# Patient Record
Sex: Male | Born: 1980 | Hispanic: No | Marital: Married | Smoking: Never smoker
Health system: Southern US, Community
[De-identification: ages and names within clinical notes are randomized; demographics above are authoritative.]

---

## 2021-06-01 ENCOUNTER — Other Ambulatory Visit: Payer: Self-pay

## 2021-06-01 ENCOUNTER — Encounter (HOSPITAL_COMMUNITY): Payer: Self-pay | Admitting: Emergency Medicine

## 2021-06-01 ENCOUNTER — Emergency Department (HOSPITAL_COMMUNITY)
Admission: EM | Admit: 2021-06-01 | Discharge: 2021-06-01 | Disposition: A | Discharge status: Home / Self Care | Payer: BC Managed Care – PPO | Attending: Emergency Medicine | Admitting: Emergency Medicine

## 2021-06-01 ENCOUNTER — Emergency Department (HOSPITAL_COMMUNITY): Payer: BC Managed Care – PPO

## 2021-06-01 DIAGNOSIS — R0789 Other chest pain: Secondary | ICD-10-CM | POA: Diagnosis present

## 2021-06-01 DIAGNOSIS — I493 Ventricular premature depolarization: Secondary | ICD-10-CM

## 2021-06-01 DIAGNOSIS — R079 Chest pain, unspecified: Secondary | ICD-10-CM

## 2021-06-01 LAB — BASIC METABOLIC PANEL
Anion gap: 8 (ref 5–15)
BUN: 10 mg/dL (ref 6–20)
CO2: 24 mmol/L (ref 22–32)
Calcium: 9 mg/dL (ref 8.9–10.3)
Chloride: 103 mmol/L (ref 98–111)
Creatinine, Ser: 0.94 mg/dL (ref 0.61–1.24)
GFR, Estimated: >60 mL/min (ref >60)
Glucose, Bld: 138 mg/dL — ABNORMAL HIGH (ref 70–99)
Potassium: 3.6 mmol/L (ref 3.5–5.1)
Sodium: 135 mmol/L (ref 135–145)

## 2021-06-01 LAB — CBC
HCT: 45.4 % (ref 39.0–52.0)
Hemoglobin: 15.6 g/dL (ref 13.0–17.0)
MCH: 30.3 pg (ref 26.0–34.0)
MCHC: 34.4 g/dL (ref 30.0–36.0)
MCV: 88.2 fL (ref 80.0–100.0)
Platelets: 205 10*3/uL (ref 150–400)
RBC: 5.15 MIL/uL (ref 4.22–5.81)
RDW: 11.6 % (ref 11.5–15.5)
WBC: 6 10*3/uL (ref 4.0–10.5)
nRBC: 0 % (ref 0.0–0.2)

## 2021-06-01 LAB — TROPONIN I (HIGH SENSITIVITY)
Troponin I (High Sensitivity): 3 ng/L (ref <18)
Troponin I (High Sensitivity): 3 ng/L (ref <18)

## 2021-06-01 LAB — D-DIMER, QUANTITATIVE: D-Dimer, Quant: <0.27 ug/mL-FEU (ref 0.00–0.50)

## 2021-06-01 MED ORDER — ALUM & MAG HYDROXIDE-SIMETH 200-200-20 MG/5ML PO SUSP
30.0000 mL | Freq: Once | ORAL | Status: AC
  Administered 2021-06-01: 30 mL via ORAL
  Filled 2021-06-01: qty 30

## 2021-06-01 NOTE — ED Notes (Signed)
Patient verbalizes understanding of discharge instructions. Opportunity for questioning and answers were provided. Armband removed by staff, pt discharged from ED ambulatory.   

## 2021-06-01 NOTE — ED Provider Notes (Signed)
Emergency Medicine Provider Triage Evaluation Note  Brion Sossamon , a 40 y.o. male  was evaluated in triage.  Pt complains of an episode of chest pain, sob and palpitations.  Review of Systems  Positive: Cp, sob, palpitations Negative: fever  Physical Exam  BP (!) 152/109 (BP Location: Left Arm)   Pulse (!) 102   Temp 98.8 F (37.1 C) (Oral)   Resp 12   SpO2 100%  Gen:   Awake, no distress   Resp:  Normal effort  MSK:   Moves extremities without difficulty  Other:  Tachycardic, regular rhythm  Medical Decision Making  Medically screening exam initiated at 2:10 PM.  Appropriate orders placed.  Oneil Hodes was informed that the remainder of the evaluation will be completed by another provider, this initial triage assessment does not replace that evaluation, and the importance of remaining in the ED until their evaluation is complete.     Karrie Meres, PA-C 06/01/21 1411    Rozelle Logan, DO 06/03/21 2055

## 2021-06-01 NOTE — Discharge Instructions (Addendum)
Try magnesium supplementation.  Follow-up with cardiology and family doctor in the office.  Please return for worsening or persistent symptoms or symptoms that occur upon exercise.  Your D-dimer test was negative which means this is very unlikely to be a blood clot in the lung your troponins were negative which makes it very unlikely to be a heart attack.

## 2021-06-01 NOTE — ED Provider Notes (Signed)
[facility] EMERGENCY DEPARTMENT Provider Note   CSN: [id number] Arrival date & time: 06/01/21  1359     History Chief Complaint  Patient presents with   Chest Pain   Shortness of Breath   Palpitations    Jeffrey Hansen is a 40 y.o. male.  40 yo M with a chief complaint of chest pain.  This is been going on just today.  Lasted for less than a second and then resolved.  Left-sided and sharp.  Nothing seems to make this come on.  He has been getting up and walking around seems to make it better.  Denies exertional symptoms.  Denies cough congestion or fever denies trauma.  Patient denies history of MI, denies hypertension hyperlipidemia diabetes or smoking.  Denies family history of MI.  Patient denies history of PE or DVT denies hemoptysis denies unilateral lower extremity edema denies recent surgery, hospitalization, estrogen use or history of cancer. Did recently travel to [place] and back about two weeks ago.     The history is provided by the patient.  Chest Pain Pain location:  L chest Pain quality: sharp   Pain radiates to:  Does not radiate Pain severity:  Moderate Onset quality:  Sudden Duration:  12 hours Timing:  Intermittent Progression:  Waxing and waning Chronicity:  New Relieved by:  Nothing Worsened by:  Nothing Ineffective treatments:  None tried Associated symptoms: palpitations and shortness of breath   Associated symptoms: no abdominal pain, no fever, no headache and no vomiting   Shortness of Breath Associated symptoms: chest pain   Associated symptoms: no abdominal pain, no fever, no headaches, no rash and no vomiting   Palpitations Associated symptoms: chest pain and shortness of breath   Associated symptoms: no vomiting       History reviewed. No pertinent past medical history.  There are no problems to display for this patient.   History reviewed. No pertinent surgical history.     No family history on file.     Home  Medications Prior to Admission medications   Not on File    Allergies    Patient has no known allergies.  Review of Systems   Review of Systems  Constitutional:  Negative for chills and fever.  HENT:  Negative for congestion and facial swelling.   Eyes:  Negative for discharge and visual disturbance.  Respiratory:  Positive for shortness of breath.   Cardiovascular:  Positive for chest pain and palpitations.  Gastrointestinal:  Negative for abdominal pain, diarrhea and vomiting.  Musculoskeletal:  Negative for arthralgias and myalgias.  Skin:  Negative for color change and rash.  Neurological:  Negative for tremors, syncope and headaches.  Psychiatric/Behavioral:  Negative for confusion and dysphoric mood.    Physical Exam Updated Vital Signs BP (!) 127/94   Pulse 82   Temp 98.8 F (37.1 C) (Oral)   Resp 18   Ht 5\' 10"  (1.778 m)   Wt 95.3 kg   SpO2 98%   BMI 30.13 kg/m   Physical Exam Vitals and nursing note reviewed.  Constitutional:      Appearance: He is well-developed.  HENT:     Head: Normocephalic and atraumatic.  Eyes:     Pupils: Pupils are equal, round, and reactive to light.  Neck:     Vascular: No JVD.  Cardiovascular:     Rate and Rhythm: Normal rate and regular rhythm.     Heart sounds: No murmur heard.   No friction rub.  No gallop.  Pulmonary:     Effort: No respiratory distress.     Breath sounds: No wheezing.  Abdominal:     General: There is no distension.     Tenderness: There is no abdominal tenderness. There is no guarding or rebound.  Musculoskeletal:        General: Normal range of motion.     Cervical back: Normal range of motion and neck supple.  Skin:    Coloration: Skin is not pale.     Findings: No rash.  Neurological:     Mental Status: He is alert and oriented to person, place, and time.  Psychiatric:        Behavior: Behavior normal.    ED Results / Procedures / Treatments   Labs (all labs ordered are listed, but only  abnormal results are displayed) Labs Reviewed  BASIC METABOLIC PANEL - Abnormal; Notable for the following components:      Result Value   Glucose, Bld 138 (*)    All other components within normal limits  CBC  D-DIMER, QUANTITATIVE  TROPONIN I (HIGH SENSITIVITY)  TROPONIN I (HIGH SENSITIVITY)    EKG EKG Interpretation  Date/Time:  Monday June 01 2021 14:06:13 EDT Ventricular Rate:  94 PR Interval:  154 QRS Duration: 86 QT Interval:  314 QTC Calculation: 392 R Axis:   26 Text Interpretation: Sinus rhythm with marked sinus arrhythmia with occasional Premature ventricular complexes Otherwise normal ECG No old tracing to compare Confirmed by Melene Plan [phone]) on 06/01/2021 7:39:34 PM  Radiology DG Chest 1 View  Result Date: 06/01/2021 CLINICAL DATA:  Left-sided chest pain, shortness of breath. EXAM: CHEST  1 VIEW COMPARISON:  None. FINDINGS: The heart size and mediastinal contours are within normal limits. Both lungs are clear. The visualized skeletal structures are unremarkable. IMPRESSION: No active disease. Electronically Signed   By: Lupita Raider M.D.   On: 06/01/2021 15:02    Procedures Procedures   Medications Ordered in ED Medications  alum & mag hydroxide-simeth (MAALOX/MYLANTA) 200-200-20 MG/5ML suspension 30 mL (30 mLs Oral Given 06/01/21 2026)    ED Course  I have reviewed the triage vital signs and the nursing notes.  Pertinent labs & imaging results that were available during my care of the patient were reviewed by me and considered in my medical decision making (see chart for details).    MDM Rules/Calculators/A&P                           40 yo M with a chief complaints of chest pain.  This is atypical in nature lasting for less than a second at a time left-sided.  He does have recurrent PVCs on his EKG.  Could be symptomatic PVCs.  2 troponins are negative.  No significant electrolyte abnormality no significant anemia.  He was tachycardic upon arrival  and has had a recent prolonged travel will obtain a D-dimer.  D-dimer negative.  Discharge home.  10:34 PM:  I have discussed the diagnosis/risks/treatment options with the patient and believe the pt to be eligible for discharge home to follow-up with PCP, cards. We also discussed returning to the ED immediately if new or worsening sx occur. We discussed the sx which are most concerning (e.g., sudden worsening pain, fever, inability to tolerate by mouth) that necessitate immediate return. Medications administered to the patient during their visit and any new prescriptions provided to the patient are listed below.  Medications given  during this visit Medications  alum & mag hydroxide-simeth (MAALOX/MYLANTA) 200-200-20 MG/5ML suspension 30 mL (30 mLs Oral Given 06/01/21 2026)     The patient appears reasonably screen and/or stabilized for discharge and I doubt any other medical condition or other [facility] requiring further screening, evaluation, or treatment in the ED at this time prior to discharge.   Final Clinical Impression(s) / ED Diagnoses Final diagnoses:  Atypical chest pain  PVC (premature ventricular contraction)    Rx / DC Orders ED Discharge Orders     None        Melene Plan, DO 06/01/21 2234

## 2021-06-01 NOTE — ED Triage Notes (Signed)
Pt reports L sided chest pain, palpitations, and shob onse this morning. Pain is intermittent.

## 2021-07-28 ENCOUNTER — Ambulatory Visit: Payer: BC Managed Care – PPO | Admitting: Physician Assistant

## 2021-08-18 ENCOUNTER — Encounter: Payer: Self-pay | Admitting: Physician Assistant

## 2021-08-18 ENCOUNTER — Ambulatory Visit (INDEPENDENT_AMBULATORY_CARE_PROVIDER_SITE_OTHER): Payer: BC Managed Care – PPO | Admitting: Physician Assistant

## 2021-08-18 ENCOUNTER — Other Ambulatory Visit: Payer: Self-pay

## 2021-08-18 VITALS — BP 133/93 | HR 75 | Temp 98.3°F | Ht 70.0 in | Wt 237.2 lb

## 2021-08-18 DIAGNOSIS — Z833 Family history of diabetes mellitus: Secondary | ICD-10-CM

## 2021-08-18 DIAGNOSIS — R030 Elevated blood-pressure reading, without diagnosis of hypertension: Secondary | ICD-10-CM

## 2021-08-18 DIAGNOSIS — R0789 Other chest pain: Secondary | ICD-10-CM

## 2021-08-18 NOTE — Progress Notes (Signed)
Subjective:    Patient ID: Jeffrey Hansen, male    DOB: Jul 04, 1981, 40 y.o.   MRN: [id number]  Chief Complaint  Patient presents with   New Patient (Initial Visit)    HPI Patient is in today to establish care.   Acute Concerns:  Atypical Chest Pain  On August 15th of this year, Jeffrey Hansen presented to the ED withc/o palpitations, left sided CP, and SOB. At the time Jeffrey Hansen described the chest pain as sharp and reported it lasted for less than a second before resolving itself. During this visit Jeffrey Hansen had a EKG performed which resulted as normal with occasional premature ventricular complexes .   Currently Jeffrey Hansen states Jeffrey Hansen visited the ED for his sx since a close colleague of his passed away in their sleep from unresolved heart issues. At this time Jeffrey Hansen is not interested in following up with cardiology. Since this visit Jeffrey Hansen has not experienced any more sx and is managing well.   Denies hx of MI, HTN, HLD, DM, or smoking.   Chronic Concerns:  None discussed at this time.   History reviewed. No pertinent past medical history.  History reviewed. No pertinent surgical history.  Family History  Problem Relation Age of Onset   Diabetes Mother     Social History   Tobacco Use   Smoking status: Never    Passive exposure: Never   Smokeless tobacco: Never  Vaping Use   Vaping Use: Never used  Substance Use Topics   Alcohol use: Yes    Alcohol/week: 2.0 standard drinks    Types: 2 Cans of beer per week     No Known Allergies  Review of Systems REFER TO HPI FOR PERTINENT POSITIVES AND NEGATIVES      Objective:     BP (!) 133/93   Pulse 75   Temp 98.3 F (36.8 C)   Ht 5\' 10"  (1.778 m)   Wt 237 lb 3.2 oz (107.6 kg)   SpO2 97%   BMI 34.03 kg/m   Wt Readings from Last 3 Encounters:  08/18/21 237 lb 3.2 oz (107.6 kg)  06/01/21 210 lb (95.3 kg)    BP Readings from Last 3 Encounters:  08/18/21 (!) 133/93  06/01/21 123/81     Physical Exam Vitals and nursing note  reviewed.  Constitutional:      General: Jeffrey Hansen is not in acute distress.    Appearance: Jeffrey Hansen is well-developed. Jeffrey Hansen is not ill-appearing or toxic-appearing.  Cardiovascular:     Rate and Rhythm: Normal rate and regular rhythm.     Pulses: Normal pulses.     Heart sounds: Normal heart sounds, S1 normal and S2 normal.  Pulmonary:     Effort: Pulmonary effort is normal.     Breath sounds: Normal breath sounds.  Skin:    General: Skin is warm and dry.  Neurological:     Mental Status: Jeffrey Hansen is alert.     GCS: GCS eye subscore is 4. GCS verbal subscore is 5. GCS motor subscore is 6.  Psychiatric:        Speech: Speech normal.        Behavior: Behavior normal. Behavior is cooperative.       Assessment & Plan:   Problem List Items Addressed This Visit   None Visit Diagnoses     Atypical chest pain    -  Primary   Family history of diabetes mellitus in mother       Elevated blood-pressure reading without diagnosis of  hypertension           1. Atypical chest pain -I personally reviewed ED visit from 06/01/21. Jeffrey Hansen had PVCs present on EKG and Jeffrey Hansen admits to a lot of caffeine intake, also had newborn around this time. -Jeffrey Hansen does not want to f/up with cardiology as Jeffrey Hansen has not had anymore symptoms since that visit. Agreed with this plan and will continue to monitor.  2. Family history of diabetes mellitus in mother -No symptoms in patient. Recently had labs done in July 2022 in [place], all normal. Will recheck next year.   3. Elevated blood-pressure reading without diagnosis of hypertension -States Jeffrey Hansen has some white coat syndrome. Continue to monitor at home. Healthy diet / exercise encouraged.    This note was prepared with assistance of Conservation officer, historic buildings. Occasional wrong-word or sound-a-like substitutions may have occurred due to the inherent limitations of voice recognition software.  Time Spent: 30 minutes of total time was spent on the date of the encounter performing the  following actions: chart review prior to seeing the patient, obtaining history, performing a medically necessary exam, counseling on the treatment plan, placing orders, and documenting in our EHR.     I,Havlyn C Ratchford,acting as a scribe for [employer], PA-C.,have documented all relevant documentation on the behalf of Lyris Hitchman M Seraphina Mitchner, PA-C,as directed by  [employer], PA-C while in the presence of Cortny Bambach M Maricus Tanzi, PA-C.  I, Julliana Whitmyer M Alvira Hecht, PA-C, have reviewed all documentation for this visit. The documentation on 08/18/21 for the exam, diagnosis, procedures, and orders are all accurate and complete.

## 2021-08-18 NOTE — Patient Instructions (Addendum)
Very good to meet you today.  Please monitor BP. Keep up good work with health. Call if any concerns come up.

## 2022-07-12 ENCOUNTER — Encounter: Payer: Self-pay | Admitting: *Deleted

## 2022-09-30 ENCOUNTER — Encounter: Payer: Self-pay | Admitting: *Deleted

## 2023-02-18 IMAGING — DX DG CHEST 1V
1 series · 1 of 1 positions shown · non-contrast
Comparison: None.

CLINICAL DATA: Left-sided chest pain, shortness of breath.

EXAM:
CHEST  1 VIEW

[chest pa]
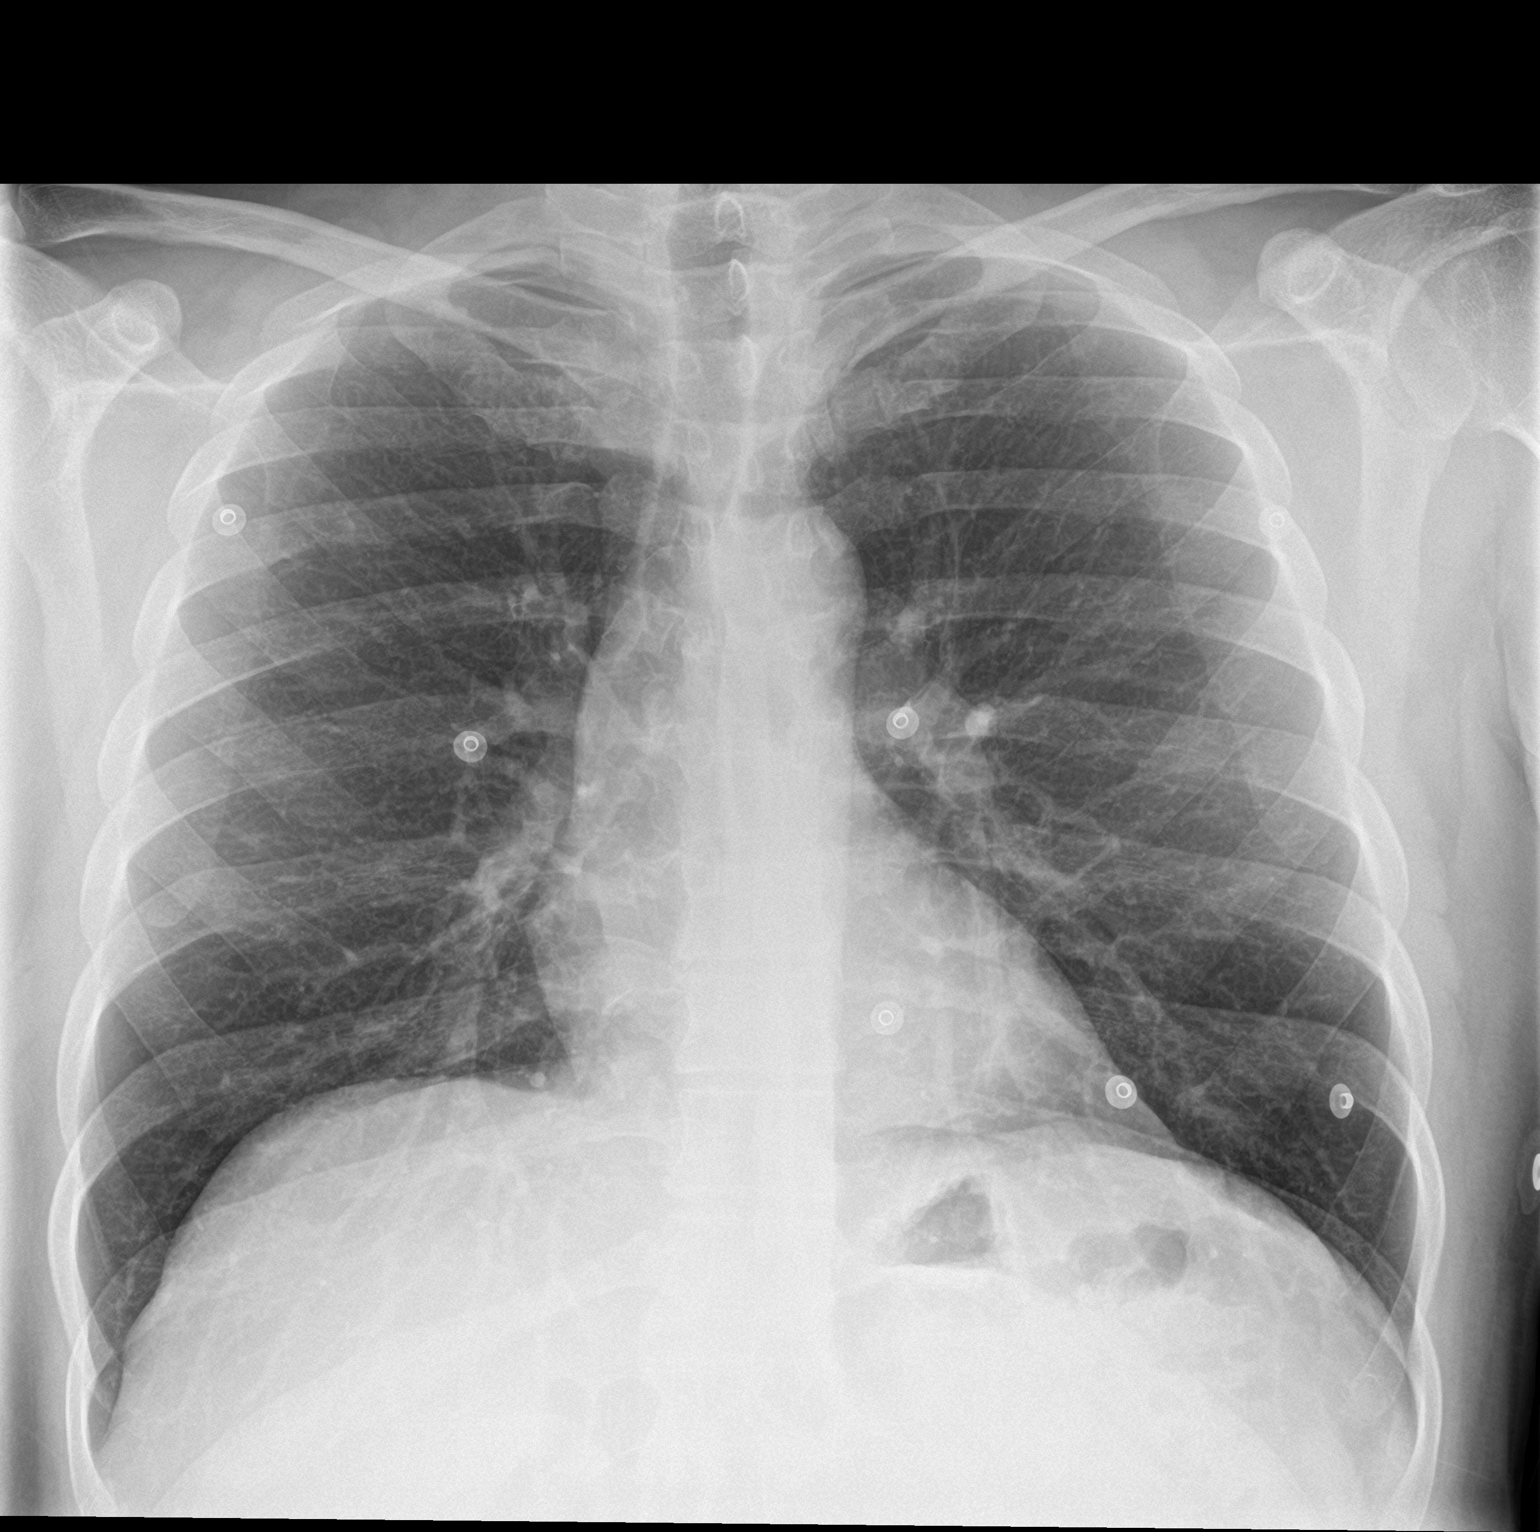

[1 of 1 positions shown; findings below may reference images not displayed]

FINDINGS: The heart size and mediastinal contours are within normal limits.
Both lungs are clear. The visualized skeletal structures are
unremarkable.
IMPRESSION: No active disease.

## 2024-06-07 ENCOUNTER — Encounter: Payer: Self-pay | Admitting: Physician Assistant

## 2024-06-07 ENCOUNTER — Ambulatory Visit: Payer: Self-pay | Admitting: Physician Assistant

## 2024-06-07 VITALS — BP 154/104 | HR 75 | Temp 98.2°F | Ht 70.0 in | Wt 246.4 lb

## 2024-06-07 DIAGNOSIS — Z131 Encounter for screening for diabetes mellitus: Secondary | ICD-10-CM | POA: Diagnosis not present

## 2024-06-07 DIAGNOSIS — Z0001 Encounter for general adult medical examination with abnormal findings: Secondary | ICD-10-CM | POA: Diagnosis not present

## 2024-06-07 DIAGNOSIS — Z6835 Body mass index (BMI) 35.0-35.9, adult: Secondary | ICD-10-CM

## 2024-06-07 DIAGNOSIS — Z1159 Encounter for screening for other viral diseases: Secondary | ICD-10-CM

## 2024-06-07 DIAGNOSIS — L409 Psoriasis, unspecified: Secondary | ICD-10-CM

## 2024-06-07 DIAGNOSIS — Z0000 Encounter for general adult medical examination without abnormal findings: Secondary | ICD-10-CM

## 2024-06-07 DIAGNOSIS — Z114 Encounter for screening for human immunodeficiency virus [HIV]: Secondary | ICD-10-CM | POA: Diagnosis not present

## 2024-06-07 DIAGNOSIS — Z13220 Encounter for screening for lipoid disorders: Secondary | ICD-10-CM

## 2024-06-07 DIAGNOSIS — R030 Elevated blood-pressure reading, without diagnosis of hypertension: Secondary | ICD-10-CM

## 2024-06-07 LAB — CBC WITH DIFFERENTIAL/PLATELET
Basophils Absolute: 0 K/uL (ref 0.0–0.1)
Basophils Relative: 0.6 % (ref 0.0–3.0)
Eosinophils Absolute: 0.1 K/uL (ref 0.0–0.7)
Eosinophils Relative: 3 % (ref 0.0–5.0)
HCT: 47 % (ref 39.0–52.0)
Hemoglobin: 15.7 g/dL (ref 13.0–17.0)
Lymphocytes Relative: 36.3 % (ref 12.0–46.0)
Lymphs Abs: 1.7 K/uL (ref 0.7–4.0)
MCHC: 33.5 g/dL (ref 30.0–36.0)
MCV: 87.1 fl (ref 78.0–100.0)
Monocytes Absolute: 0.4 K/uL (ref 0.1–1.0)
Monocytes Relative: 7.7 % (ref 3.0–12.0)
Neutro Abs: 2.5 K/uL (ref 1.4–7.7)
Neutrophils Relative %: 52.4 % (ref 43.0–77.0)
Platelets: 209 K/uL (ref 150.0–400.0)
RBC: 5.4 Mil/uL (ref 4.22–5.81)
RDW: 12.9 % (ref 11.5–15.5)
WBC: 4.7 K/uL (ref 4.0–10.5)

## 2024-06-07 LAB — LIPID PANEL
Cholesterol: 191 mg/dL (ref 0–200)
HDL: 31 mg/dL — ABNORMAL LOW (ref >39.00)
LDL Cholesterol: 112 mg/dL — ABNORMAL HIGH (ref 0–99)
NonHDL: 160.02
Total CHOL/HDL Ratio: 6
Triglycerides: 242 mg/dL — ABNORMAL HIGH (ref 0.0–149.0)
VLDL: 48.4 mg/dL — ABNORMAL HIGH (ref 0.0–40.0)

## 2024-06-07 LAB — COMPREHENSIVE METABOLIC PANEL WITH GFR
ALT: 39 U/L (ref 0–53)
AST: 26 U/L (ref 0–37)
Albumin: 4.5 g/dL (ref 3.5–5.2)
Alkaline Phosphatase: 42 U/L (ref 39–117)
BUN: 10 mg/dL (ref 6–23)
CO2: 26 meq/L (ref 19–32)
Calcium: 9.5 mg/dL (ref 8.4–10.5)
Chloride: 105 meq/L (ref 96–112)
Creatinine, Ser: 0.94 mg/dL (ref 0.40–1.50)
GFR: 98.95 mL/min (ref >60.00)
Glucose, Bld: 94 mg/dL (ref 70–99)
Potassium: 4.3 meq/L (ref 3.5–5.1)
Sodium: 139 meq/L (ref 135–145)
Total Bilirubin: 0.6 mg/dL (ref 0.2–1.2)
Total Protein: 7.2 g/dL (ref 6.0–8.3)

## 2024-06-07 LAB — VITAMIN B12: Vitamin B-12: 242 pg/mL (ref 211–911)

## 2024-06-07 LAB — VITAMIN D 25 HYDROXY (VIT D DEFICIENCY, FRACTURES): VITD: 23.16 ng/mL — ABNORMAL LOW (ref 30.00–100.00)

## 2024-06-07 LAB — HEMOGLOBIN A1C: Hgb A1c MFr Bld: 5.9 % (ref 4.6–6.5)

## 2024-06-07 LAB — TSH: TSH: 3.51 u[IU]/mL (ref 0.35–5.50)

## 2024-06-07 MED ORDER — TRIAMCINOLONE ACETONIDE 0.5 % EX CREA: 1.0000 | TOPICAL_CREAM | Freq: Three times a day (TID) | CUTANEOUS | 0 refills | Status: AC

## 2024-06-07 NOTE — Progress Notes (Signed)
 Patient ID: Jeffrey Hansen, male    DOB: 1980-12-10, 43 y.o.   MRN: [id number]   Assessment & Plan:  Annual physical exam -     CBC with Differential/Platelet -     Comprehensive metabolic panel with GFR -     Hemoglobin A1c -     Lipid panel -     TSH -     Vitamin B12 -     VITAMIN D  25 Hydroxy (Vit-D Deficiency, Fractures)  Screening for HIV (human immunodeficiency virus) -     HIV Antibody (routine testing w rflx)  Need for hepatitis C screening test -     Hepatitis C antibody  Psoriasis  Elevated blood pressure reading  Other orders -     Triamcinolone  Acetonide; Apply 1 Application topically 3 (three) times daily. Apply to affected skin areas no longer than 2 weeks consistently.  Dispense: 60 g; Refill: 0    Assessment & Plan Hypertension Blood pressure is elevated today and was high during a previous visit. There is a family history of hypertension. He does not have a home blood pressure monitor. Discussed the importance of monitoring blood pressure and potential genetic predisposition. Emphasized lifestyle modifications, including dietary changes and increased physical activity, to manage blood pressure. Goal blood pressure is 120/80 mmHg, with treatment required for readings above 130/80 mmHg. - Order home blood pressure monitoring - Schedule follow-up appointment in 4 weeks if blood pressure remains high - Encourage dietary changes, such as switching from white to brown rice - Advise on increasing physical activity  Obesity Reports weight fluctuations and a sedentary lifestyle due to work commitments. Discussed the impact of diet and physical activity on weight management. Emphasized consistent lifestyle changes to achieve and maintain a healthy weight. - Encourage dietary modifications, including reducing intake of white rice and pasta - Advise on increasing physical activity, such as walking or using a bike  Allergic rhinitis Undergoing treatment with allergist  and has started a regimen of allergy shots since September. Symptoms are most severe in February, March, and April. Discussed the long-term commitment required for allergy shots and the potential benefits in reducing symptoms.  Psoriasis Psoriasis is present on both arms, with symptoms that come and go. No itching reported. Previous treatment with a gel in [place]. Discussed the use of topical treatment to manage symptoms. - Prescribe Kenalog  cream to be applied 2-3 times a day for up to 2 weeks - Advise use of moisturizer after Kenalog  treatment  Headache Intermittent headaches on the lower end of the head, possibly related to stress and prolonged computer use. Family history suggests concern for hypertension as a potential cause. Discussed the importance of monitoring blood pressure and managing stress to alleviate headaches. - Monitor blood pressure at home  Age-appropriate screening and counseling performed today. Will check labs and call with results. Preventive measures discussed and printed in AVS for patient.   Patient Counseling: [x]   Nutrition: Stressed importance of moderation in sodium/caffeine intake, saturated fat and cholesterol, caloric balance, sufficient intake of fresh fruits, vegetables, and fiber.  [x]   Stressed the importance of regular exercise.   [x]   Substance Abuse: Discussed cessation/primary prevention of tobacco, alcohol, or other drug use; driving or other dangerous activities under the influence; availability of treatment for abuse.   []   Injury prevention: Discussed safety belts, safety helmets, smoke detector, smoking near bedding or upholstery.   []   Sexuality: Discussed sexually transmitted diseases, partner selection, use of condoms, avoidance of unintended  pregnancy  and contraceptive alternatives.   [x]   Dental health: Discussed importance of regular tooth brushing, flossing, and dental visits.  [x]   Health maintenance and immunizations reviewed. Please refer  to Health maintenance section.         Return in about 4 weeks (around 07/05/2024) for recheck/follow-up, blood pressure check.    Subjective:    Chief Complaint  Patient presents with   Annual Exam    Pt in office for annual CPE and fasting labs; pt had coffee latte this morning with little milk but nothing to eat before appt;     HPI Discussed the use of AI scribe software for clinical note transcription with the patient, who gave verbal consent to proceed.  History of Present Illness Jeffrey Hansen is a 43 year old male who presents for a routine follow-up and evaluation of hypertension and allergies.  He has been undergoing allergen immunotherapy since September, opting for shots over pills. He experiences significant allergy symptoms during February, March, and April.  He experiences headaches located on the lower end of his head, which he associates with stress and prolonged computer use. His father suggests these may be related to high blood pressure, which was noted to be elevated during this visit and in the past. He does not have a home blood pressure monitor but acknowledges a family history of hypertension, with both parents having been treated for it.  He follows a diet low in red meat, which he believes contributes to low B12 levels. His diet primarily consists of vegetables, rice, pasta, chicken, and fish, and he regularly consumes coffee and tea while avoiding sodas and juices.  He has a history of psoriasis, which is not currently being treated and does not cause itching, but results in color changes on his arms. He has previously used a gel treatment prescribed in [place].  He reports no issues with sleep, stating he sleeps well once he falls asleep, although he sometimes sleeps less due to work demands. He works as a Licensed conveyancer in IT consultant at [employer], which involves significant sedentary time, contributing to weight fluctuations. He notes a pattern of  losing weight before traveling to [place] and regaining it while there.  No family history of diabetes is reported. He denies smoking and vaping, and reports occasional alcohol use.     History reviewed. No pertinent past medical history.  History reviewed. No pertinent surgical history.  Family History  Problem Relation Age of Onset   Diabetes Mother    Hypertension Father     Social History   Tobacco Use   Smoking status: Never    Passive exposure: Never   Smokeless tobacco: Never  Vaping Use   Vaping status: Never Used  Substance Use Topics   Alcohol use: Yes    Alcohol/week: 2.0 standard drinks of alcohol    Types: 2 Cans of beer per week     No Known Allergies  Review of Systems NEGATIVE UNLESS OTHERWISE INDICATED IN HPI      Objective:     BP (!) 154/104 (BP Location: Left Arm, Patient Position: Sitting, Cuff Size: Large) Comment (Patient Position): Manually  Pulse 75   Temp 98.2 F (36.8 C) (Temporal)   Ht 5' 10 (1.778 m)   Wt 246 lb 6.4 oz (111.8 kg)   SpO2 98%   BMI 35.35 kg/m   Wt Readings from Last 3 Encounters:  06/07/24 246 lb 6.4 oz (111.8 kg)  08/18/21 237 lb 3.2 oz (107.6  kg)  06/01/21 210 lb (95.3 kg)    BP Readings from Last 3 Encounters:  06/07/24 (!) 154/104  08/18/21 (!) 133/93  06/01/21 123/81     Physical Exam Vitals and nursing note reviewed.  Constitutional:      General: He is not in acute distress.    Appearance: Normal appearance. He is obese. He is not toxic-appearing.  HENT:     Head: Normocephalic and atraumatic.     Right Ear: Tympanic membrane, ear canal and external ear normal.     Left Ear: Tympanic membrane, ear canal and external ear normal.     Nose: Nose normal.     Mouth/Throat:     Mouth: Mucous membranes are moist.     Pharynx: Oropharynx is clear.  Eyes:     Extraocular Movements: Extraocular movements intact.     Conjunctiva/sclera: Conjunctivae normal.     Pupils: Pupils are equal, round, and  reactive to light.  Cardiovascular:     Rate and Rhythm: Normal rate and regular rhythm.     Pulses: Normal pulses.     Heart sounds: Normal heart sounds.  Pulmonary:     Effort: Pulmonary effort is normal.     Breath sounds: Normal breath sounds.  Abdominal:     General: Abdomen is flat. Bowel sounds are normal.     Palpations: Abdomen is soft.     Tenderness: There is no abdominal tenderness.  Musculoskeletal:        General: Normal range of motion.     Cervical back: Normal range of motion and neck supple.     Right lower leg: No edema.     Left lower leg: No edema.  Skin:    General: Skin is warm and dry.     Findings: Rash (scattered dry patches) present. No lesion.  Neurological:     General: No focal deficit present.     Mental Status: He is alert and oriented to person, place, and time.  Psychiatric:        Mood and Affect: Mood normal.        Behavior: Behavior normal.             Karena Kinker M Detravion Tester, PA-C

## 2024-06-08 ENCOUNTER — Ambulatory Visit: Payer: Self-pay | Admitting: Physician Assistant

## 2024-06-08 LAB — HIV ANTIBODY (ROUTINE TESTING W REFLEX): HIV 1&2 Ab, 4th Generation: NONREACTIVE

## 2024-06-08 LAB — HEPATITIS C ANTIBODY: Hepatitis C Ab: NONREACTIVE

## 2024-06-11 NOTE — Patient Instructions (Signed)
  VISIT SUMMARY: Today, we discussed your hypertension, weight management, allergies, psoriasis, and headaches. We reviewed your current treatments and made some adjustments to help manage your conditions better.  YOUR PLAN: HYPERTENSION: Your blood pressure is elevated, and you have a family history of hypertension. -Order a home blood pressure monitor to track your readings. -Schedule a follow-up appointment in 4 weeks if your blood pressure remains high. -Make dietary changes, such as switching from white to brown rice. -Increase your physical activity.  OBESITY: You have experienced weight fluctuations and lead a sedentary lifestyle due to work. -Reduce intake of white rice and pasta. -Increase physical activity, such as walking or using a bike.  ALLERGIC RHINITIS: You are undergoing allergy shots and experience severe symptoms in February, March, and April. -Continue with your allergy shots as prescribed.  PSORIASIS: You have psoriasis on your arms, which is not currently itchy but causes color changes. -Use Kenalog  cream 2-3 times a day for up to 2 weeks. -Apply moisturizer after using Kenalog  cream.  HEADACHE: You experience headaches that may be related to stress, prolonged computer use, or high blood pressure. -Monitor your blood pressure at home. -Manage stress to help alleviate headaches.                      Contains text generated by [employer].                                 Contains text generated by [employer].

## 2024-07-05 ENCOUNTER — Ambulatory Visit: Admitting: Physician Assistant

## 2024-07-05 ENCOUNTER — Encounter: Payer: Self-pay | Admitting: Physician Assistant

## 2024-07-05 VITALS — BP 130/80 | HR 89 | Temp 98.2°F | Ht 70.0 in | Wt 235.8 lb

## 2024-07-05 DIAGNOSIS — E782 Mixed hyperlipidemia: Secondary | ICD-10-CM | POA: Diagnosis not present

## 2024-07-05 DIAGNOSIS — Z6833 Body mass index (BMI) 33.0-33.9, adult: Secondary | ICD-10-CM

## 2024-07-05 DIAGNOSIS — E559 Vitamin D deficiency, unspecified: Secondary | ICD-10-CM

## 2024-07-05 DIAGNOSIS — E66811 Obesity, class 1: Secondary | ICD-10-CM

## 2024-07-05 DIAGNOSIS — R030 Elevated blood-pressure reading, without diagnosis of hypertension: Secondary | ICD-10-CM | POA: Diagnosis not present

## 2024-07-05 DIAGNOSIS — E6609 Other obesity due to excess calories: Secondary | ICD-10-CM

## 2024-07-05 NOTE — Progress Notes (Signed)
 Patient ID: Jeffrey Hansen, male    DOB: 04-27-1981, 43 y.o.   MRN: [id number]   Assessment & Plan:  Elevated blood pressure reading  Class 1 obesity due to excess calories without serious comorbidity with body mass index (BMI) of 33.0 to 33.9 in adult  Mixed hyperlipidemia  Vitamin D  deficiency    Assessment & Plan Elevated blood pressure Blood pressure improved from 142/106 mmHg to 130/80 mmHg over four weeks with lifestyle modifications. Diastolic pressure remains slightly elevated. - Monitor blood pressure at home weekly. - Continue lifestyle modifications to avoid medication. - Contact if blood pressure exceeds 140/90 mmHg.  Overweight Weight loss of 10 pounds in four weeks. Current weight is 100 kg with a goal of 98 kg. Difficulty maintaining weight below 94 kg. - Continue dietary modifications and physical activity. - Reassess weight and progress in six months.  Hyperlipidemia Cholesterol levels elevated at last check. Dietary changes implemented. - Repeat fasting lipid panel in six months.  Vitamin D  deficiency Vitamin D  levels low. Taking vitamin D  supplements.      Return in about 6 months (around 01/02/2025) for recheck/follow-up, fasting labs .    Subjective:    Chief Complaint  Patient presents with   Follow-up    No concerns    HPI Discussed the use of AI scribe software for clinical note transcription with the patient, who gave verbal consent to proceed.  History of Present Illness Jeffrey Hansen is a 43 year old male who presents for a recheck on his blood pressure.  He returns for a follow-up on his blood pressure, which was elevated at his last visit with a reading of 142/106 mmHg. He has been monitoring his blood pressure at home, noting that initially it was higher but has since improved.  He has lost ten pounds over the past four weeks by making dietary changes, such as stopping eating after 5 PM, cutting out bread, pasta, and rice, and  increasing his physical activity. He has started walking more, with his wife calling him every hour to remind him to take a 15-minute walk. He aims to reach a stable weight of around 98 kilograms, as he finds it challenging to go below this without significant effort.  He has a family history of hypertension and is not currently on any blood pressure medication. He is aware of his cholesterol levels being higher than desired and is considering dietary adjustments to address this. He has started taking vitamin D  supplements due to low levels noted previously.  In terms of hydration, he acknowledges not drinking enough water, often opting for tea, coffee, and other beverages instead. He is trying to be more mindful of his water intake.  No additional symptoms reported.     History reviewed. No pertinent past medical history.  History reviewed. No pertinent surgical history.  Family History  Problem Relation Age of Onset   Diabetes Mother    Hypertension Father     Social History   Tobacco Use   Smoking status: Never    Passive exposure: Never   Smokeless tobacco: Never  Vaping Use   Vaping status: Never Used  Substance Use Topics   Alcohol use: Yes    Alcohol/week: 2.0 standard drinks of alcohol    Types: 2 Cans of beer per week     No Known Allergies  Review of Systems NEGATIVE UNLESS OTHERWISE INDICATED IN HPI      Objective:     BP 130/80   Pulse  89   Temp 98.2 F (36.8 C)   Ht 5' 10 (1.778 m)   Wt 235 lb 12.8 oz (107 kg)   SpO2 98%   BMI 33.83 kg/m   Wt Readings from Last 3 Encounters:  07/05/24 235 lb 12.8 oz (107 kg)  06/07/24 246 lb 6.4 oz (111.8 kg)  08/18/21 237 lb 3.2 oz (107.6 kg)    BP Readings from Last 3 Encounters:  07/05/24 130/80  06/07/24 (!) 154/104  08/18/21 (!) 133/93     Physical Exam Vitals and nursing note reviewed.  Constitutional:      Appearance: Normal appearance. He is obese.  Eyes:     Extraocular Movements:  Extraocular movements intact.     Conjunctiva/sclera: Conjunctivae normal.     Pupils: Pupils are equal, round, and reactive to light.  Cardiovascular:     Rate and Rhythm: Normal rate.  Pulmonary:     Effort: Pulmonary effort is normal.  Musculoskeletal:     Right lower leg: No edema.     Left lower leg: No edema.  Skin:    Findings: No rash.  Neurological:     Mental Status: He is alert.  Psychiatric:        Mood and Affect: Mood normal.             Jeffrey Hansen M Quamir Willemsen, PA-C

## 2025-01-04 ENCOUNTER — Ambulatory Visit: Admitting: Physician Assistant

## 2025-06-14 ENCOUNTER — Encounter: Admitting: Physician Assistant
